# Patient Record
Sex: Female | Born: 1995 | Race: Black or African American | Hispanic: No | Marital: Single | Smoking: Never smoker
Health system: Southern US, Community
[De-identification: ages and names within clinical notes are randomized; demographics above are authoritative.]

---

## 2014-08-02 ENCOUNTER — Emergency Department (HOSPITAL_COMMUNITY)
Admission: EM | Admit: 2014-08-02 | Discharge: 2014-08-02 | Disposition: A | Discharge status: Home / Self Care | Payer: PRIVATE HEALTH INSURANCE | Source: Home / Self Care | Attending: Emergency Medicine | Admitting: Emergency Medicine

## 2014-08-02 ENCOUNTER — Encounter (HOSPITAL_COMMUNITY): Payer: Self-pay | Admitting: Emergency Medicine

## 2014-08-02 ENCOUNTER — Emergency Department (INDEPENDENT_AMBULATORY_CARE_PROVIDER_SITE_OTHER): Payer: PRIVATE HEALTH INSURANCE

## 2014-08-02 DIAGNOSIS — M79671 Pain in right foot: Secondary | ICD-10-CM

## 2014-08-02 DIAGNOSIS — S93601A Unspecified sprain of right foot, initial encounter: Secondary | ICD-10-CM

## 2014-08-02 MED ORDER — IBUPROFEN 800 MG PO TABS: 800.0000 mg | ORAL_TABLET | Freq: Three times a day (TID) | ORAL | Status: DC

## 2014-08-02 NOTE — ED Notes (Signed)
Pt states that she was jumping around last night and her ankle started to swell.

## 2014-08-02 NOTE — Discharge Instructions (Signed)
Joint Sprain A sprain is a tear or stretch in the ligaments that hold a joint together. Severe sprains may need as long as 3-6 weeks of immobilization and/or exercises to heal completely. Sprained joints should be rested and protected. If not, they can become unstable and prone to re-injury. Proper treatment can reduce your pain, shorten the period of disability, and reduce the risk of repeated injuries. TREATMENT   Rest and elevate the injured joint to reduce pain and swelling.  Apply ice packs to the injury for 20-30 minutes every 2-3 hours for the next 2-3 days.  Keep the injury wrapped in a compression bandage or splint as long as the joint is painful or as instructed by your caregiver.  Do not use the injured joint until it is completely healed to prevent re-injury and chronic instability. Follow the instructions of your caregiver.  Long-term sprain management may require exercises and/or treatment by a physical therapist. Taping or special braces may help stabilize the joint until it is completely better. SEEK MEDICAL CARE IF:   You develop increased pain or swelling of the joint.  You develop increasing redness and warmth of the joint.  You develop a fever.  It becomes stiff.  Your hand or foot gets cold or numb. Document Released: 07/15/2004 Document Revised: 08/30/2011 Document Reviewed: 06/24/2008 [facility] Patient Information 2015 [employer] [place]. This information is not intended to replace advice given to you by your health care provider. Make sure you discuss any questions you have with your health care provider.   Ibuprofen as needed for pain. Elevate and ice over next 24-48 hours. Post op shoe x 1-2 weeks.

## 2014-08-02 NOTE — ED Provider Notes (Signed)
CSN: [id number]     Arrival date & time 08/02/14  1535 History   First MD Initiated Contact with Patient 08/02/14 1644     Chief Complaint  Patient presents with  . Foot Injury   (Consider location/radiation/quality/duration/timing/severity/associated sxs/prior Treatment) HPI Comments: Erin Velasquez presents with right foot pain. She was jumping around last night and noted pain along the right 1st MTP base. Pain and swelling through night and today. Can bear weight though painful.   Patient is a 19 y.o. female presenting with foot injury. The history is provided by the patient.  Foot Injury   History reviewed. No pertinent past medical history. History reviewed. No pertinent past surgical history. History reviewed. No pertinent family history. History  Substance Use Topics  . Smoking status: Never Smoker   . Smokeless tobacco: Not on file  . Alcohol Use: No   OB History    No data available     Review of Systems  All other systems reviewed and are negative.   Allergies  Review of patient's allergies indicates no known allergies.  Home Medications   Prior to Admission medications   Medication Sig Start Date End Date Taking? Authorizing Provider  ibuprofen (ADVIL,MOTRIN) 800 MG tablet Take 1 tablet (800 mg total) by mouth 3 (three) times daily. 08/02/14   Riki SheerMichelle G Ursula Dermody, PA-C   BP 121/65 mmHg  Pulse 70  Temp(Src) 98.1 F (36.7 C) (Oral)  Resp 18  SpO2 100%  LMP 07/19/2014 Physical Exam  Constitutional: She is oriented to person, place, and time. She appears well-developed and well-nourished. No distress.  HENT:  Head: Normocephalic.  Pulmonary/Chest: Effort normal.  Musculoskeletal:  Mild edema forefoot, no erythema or warmth Pain to palpation along the base of the 1st MT. Full ROM. Normal sensation and strength  Neurological: She is alert and oriented to person, place, and time.  Skin: Skin is warm and dry.  Psychiatric: Her behavior is normal.  Nursing note and  vitals reviewed.   ED Course  Procedures (including critical care time) Labs Review Labs Reviewed - No data to display  Imaging Review Dg Foot Complete Right  08/02/2014   CLINICAL DATA:  Patient with medial right foot pain status post injury while jumping.  EXAM: RIGHT FOOT COMPLETE - 3+ VIEW  COMPARISON:  None.  FINDINGS: There is no evidence of fracture or dislocation. There is no evidence of arthropathy or other focal bone abnormality. Soft tissues are unremarkable.  IMPRESSION: No acute osseous abnormality.   Electronically Signed   By: Annia Beltrew  Davis M.D.   On: 08/02/2014 17:32     MDM   1. Sprain of foot, right, initial encounter   2. Foot pain, right    No fractures. Treat conservatively with post op shoe/ice/ and NSAIDs. F/U with Ortho if worsens.     Riki SheerMichelle G Shemekia Patane, PA-C 08/02/14 1800

## 2016-05-15 ENCOUNTER — Ambulatory Visit (HOSPITAL_COMMUNITY): Admission: EM | Admit: 2016-05-15 | Discharge: 2016-05-15 | Discharge status: Absent without leave | Payer: Self-pay

## 2016-05-15 ENCOUNTER — Ambulatory Visit (INDEPENDENT_AMBULATORY_CARE_PROVIDER_SITE_OTHER): Payer: BC Managed Care – PPO | Admitting: Family Medicine

## 2016-05-15 VITALS — BP 108/68 | HR 90 | Temp 98.9°F | Resp 16 | Ht 62.0 in | Wt 179.0 lb

## 2016-05-15 DIAGNOSIS — J029 Acute pharyngitis, unspecified: Secondary | ICD-10-CM | POA: Diagnosis not present

## 2016-05-15 LAB — POCT RAPID STREP A (OFFICE): Rapid Strep A Screen: NEGATIVE

## 2016-05-15 MED ORDER — AZITHROMYCIN 250 MG PO TABS: ORAL_TABLET | ORAL | 0 refills | Status: DC

## 2016-05-15 NOTE — Patient Instructions (Addendum)
Start Azithromycin Take 2 tabs x 1 dose, then 1 tab every day for x 4 days.   Gargle with warm salt water to reduce throat pain.  Take Ibuprofen and Advil for throat pain.  IF you received an x-ray today, you will receive an invoice from [facility]. Please contact [facility] at [phone] with questions or concerns regarding your invoice.   IF you received labwork today, you will receive an invoice from [facility]. Please contact [facility] at [phone] with questions or concerns regarding your invoice.   Our billing staff will not be able to assist you with questions regarding bills from these companies.  You will be contacted with the lab results as soon as they are available. The fastest way to get your results is to activate your My Chart account. Instructions are located on the last page of this paperwork. If you have not heard from us regarding the results in 2 weeks, please contact this office.

## 2016-05-15 NOTE — Progress Notes (Signed)
   Patient ID: Erin Velasquez, female    DOB: Jan 15, 1996, 20 y.o.   MRN: [id number]  PCP: No PCP Per Patient  Chief Complaint  Patient presents with  . Sore Throat    X 3 days   . Ear Pain    Bilateral   . Headache    Subjective:   HPI 20 year old female presents for evaluation of sore throat, ear pain, and headache times 3 days. [facility] Illness started with sore throat.  Two nights ago throat became really dry and sore. Last night throat was painfully sore. Left and right side of her throat contains white exudate and reddened.  Unknown of fever and denies chills. No nasal drainage. Headache times two days. Denies nausea and vomiting.  Social History   Social History  . Marital status: Single    Spouse name: N/A  . Number of children: N/A  . Years of education: N/A   Occupational History  . Not on file.   Social History Main Topics  . Smoking status: Never Smoker  . Smokeless tobacco: Not on file  . Alcohol use No  . Drug use: No  . Sexual activity: Not on file   Other Topics Concern  . Not on file   Social History Narrative  . No narrative on file    No family history on file. Review of Systems See HPI There are no active problems to display for this patient.    Prior to Admission medications   Not on File     No Known Allergies     Objective:  Physical Exam  Constitutional: She is oriented to person, place, and time.  HENT:  Nose: Mucosal edema and rhinorrhea present.  Mouth/Throat: Uvula swelling present. Oropharyngeal exudate, posterior oropharyngeal edema and posterior oropharyngeal erythema present. No tonsillar abscesses.  Cardiovascular: Normal rate, regular rhythm, normal heart sounds and intact distal pulses.   Pulmonary/Chest: Effort normal and breath sounds normal.  Neurological: She is alert and oriented to person, place, and time.  Skin: Skin is warm and dry.  Psychiatric: She has a normal mood and affect. Her  behavior is normal. Thought content normal.    Vitals:   05/15/16 1504  BP: 108/68  Pulse: 90  Resp: 16  Temp: 98.9 F (37.2 C)  ;s Assessment & Plan:  1. Sore throat - POCT rapid strep A-negative - Culture, Group A Strep-pending  Start Azithromycin Take 2 tabs x 1 dose, then 1 tab every day for x 4 days.   Gargle with warm salt water to reduce throat pain.  Take Ibuprofen and Advil for throat pain.  Return for care as needed.  Godfrey PickKimberly S. Tiburcio PeaHarris, MSN, FNP-C [facility]

## 2016-05-19 LAB — CULTURE, GROUP A STREP: ORGANISM ID, BACTERIA: NORMAL

## 2016-08-19 ENCOUNTER — Emergency Department (HOSPITAL_COMMUNITY): Payer: BC Managed Care – PPO

## 2016-08-19 ENCOUNTER — Emergency Department (HOSPITAL_COMMUNITY)
Admission: EM | Admit: 2016-08-19 | Discharge: 2016-08-19 | Disposition: A | Discharge status: Home / Self Care | Payer: BC Managed Care – PPO | Attending: Emergency Medicine | Admitting: Emergency Medicine

## 2016-08-19 ENCOUNTER — Encounter (HOSPITAL_COMMUNITY): Payer: Self-pay | Admitting: Emergency Medicine

## 2016-08-19 DIAGNOSIS — Z79899 Other long term (current) drug therapy: Secondary | ICD-10-CM | POA: Insufficient documentation

## 2016-08-19 DIAGNOSIS — R200 Anesthesia of skin: Secondary | ICD-10-CM | POA: Insufficient documentation

## 2016-08-19 DIAGNOSIS — M542 Cervicalgia: Secondary | ICD-10-CM | POA: Insufficient documentation

## 2016-08-19 LAB — I-STAT BETA HCG BLOOD, ED (MC, WL, AP ONLY)

## 2016-08-19 LAB — CBC WITH DIFFERENTIAL/PLATELET
Basophils Absolute: 0.1 10*3/uL (ref 0.0–0.1)
Basophils Relative: 1 %
Eosinophils Absolute: 0.2 10*3/uL (ref 0.0–0.7)
Eosinophils Relative: 2 %
HEMATOCRIT: 37 % (ref 36.0–46.0)
Hemoglobin: 12 g/dL (ref 12.0–15.0)
LYMPHS ABS: 2.9 10*3/uL (ref 0.7–4.0)
LYMPHS PCT: 22 %
MCH: 25.3 pg — ABNORMAL LOW (ref 26.0–34.0)
MCHC: 32.4 g/dL (ref 30.0–36.0)
MCV: 77.9 fL — AB (ref 78.0–100.0)
Monocytes Absolute: 0.7 10*3/uL (ref 0.1–1.0)
Monocytes Relative: 6 %
Neutro Abs: 8.9 10*3/uL — ABNORMAL HIGH (ref 1.7–7.7)
Neutrophils Relative %: 69 %
Platelets: 429 10*3/uL — ABNORMAL HIGH (ref 150–400)
RBC: 4.75 MIL/uL (ref 3.87–5.11)
RDW: 15.7 % — ABNORMAL HIGH (ref 11.5–15.5)
WBC: 12.8 10*3/uL — AB (ref 4.0–10.5)

## 2016-08-19 LAB — BASIC METABOLIC PANEL
Anion gap: 9 (ref 5–15)
BUN: 9 mg/dL (ref 6–20)
CO2: 26 mmol/L (ref 22–32)
Calcium: 9.5 mg/dL (ref 8.9–10.3)
Chloride: 102 mmol/L (ref 101–111)
Creatinine, Ser: 0.7 mg/dL (ref 0.44–1.00)
GFR calc Af Amer: >60 mL/min (ref >60)
GLUCOSE: 105 mg/dL — AB (ref 65–99)
POTASSIUM: 3 mmol/L — AB (ref 3.5–5.1)
Sodium: 137 mmol/L (ref 135–145)

## 2016-08-19 LAB — TSH: TSH: 1.588 u[IU]/mL (ref 0.350–4.500)

## 2016-08-19 LAB — MAGNESIUM: Magnesium: 2.1 mg/dL (ref 1.7–2.4)

## 2016-08-19 MED ORDER — METOCLOPRAMIDE HCL 5 MG/ML IJ SOLN
10.0000 mg | Freq: Once | INTRAMUSCULAR | Status: AC
  Administered 2016-08-19: 10 mg via INTRAVENOUS
  Filled 2016-08-19: qty 2

## 2016-08-19 MED ORDER — METHOCARBAMOL 500 MG PO TABS
500.0000 mg | ORAL_TABLET | Freq: Three times a day (TID) | ORAL | 0 refills | Status: AC
Start: 2016-08-19 — End: 2016-08-26

## 2016-08-19 MED ORDER — KETOROLAC TROMETHAMINE 15 MG/ML IJ SOLN
15.0000 mg | Freq: Once | INTRAMUSCULAR | Status: AC
  Administered 2016-08-19: 15 mg via INTRAVENOUS
  Filled 2016-08-19: qty 1

## 2016-08-19 MED ORDER — DIPHENHYDRAMINE HCL 50 MG/ML IJ SOLN
25.0000 mg | Freq: Once | INTRAMUSCULAR | Status: AC
  Administered 2016-08-19: 25 mg via INTRAVENOUS
  Filled 2016-08-19: qty 1

## 2016-08-19 MED ORDER — SODIUM CHLORIDE 0.9 % IV BOLUS (SEPSIS)
1000.0000 mL | Freq: Once | INTRAVENOUS | Status: AC
  Administered 2016-08-19: 1000 mL via INTRAVENOUS

## 2016-08-19 MED ORDER — NAPROXEN 500 MG PO TABS: 500.0000 mg | ORAL_TABLET | Freq: Two times a day (BID) | ORAL | 0 refills | Status: AC

## 2016-08-19 MED ORDER — GADOBENATE DIMEGLUMINE 529 MG/ML IV SOLN
20.0000 mL | Freq: Once | INTRAVENOUS | Status: AC | PRN
  Administered 2016-08-19: 17 mL via INTRAVENOUS

## 2016-08-19 MED ORDER — IOPAMIDOL (ISOVUE-370) INJECTION 76%
100.0000 mL | Freq: Once | INTRAVENOUS | Status: AC | PRN
  Administered 2016-08-19: 100 mL via INTRAVENOUS

## 2016-08-19 MED ORDER — POTASSIUM CHLORIDE CRYS ER 20 MEQ PO TBCR
40.0000 meq | EXTENDED_RELEASE_TABLET | Freq: Once | ORAL | Status: AC
  Administered 2016-08-19: 40 meq via ORAL
  Filled 2016-08-19: qty 2

## 2016-08-19 NOTE — ED Triage Notes (Signed)
Pt c/o right side arm and torso pain and tingling, stiffness, weakness, onset yesterday. Pt went to Urgent Care yesterday, had MRI, prescribed steroid. Pain has not improved. Reports headaches with blurred vision and back pain x past month. Right radial pulse 2+ regular.

## 2016-08-19 NOTE — ED Notes (Signed)
Patient transported to MRI 

## 2016-08-19 NOTE — ED Provider Notes (Signed)
[facility]-EMERGENCY DEPT Provider Note   CSN: [id number] Arrival date & time: 08/19/16  0845     History   Chief Complaint Chief Complaint  Patient presents with  . Numbness    HPI Erin Velasquez is a 21 y.o. female.  HPI   21 year old female with no significant past medical history presents with right arm numbness and neck pain. Patient states her symptoms began as an aching, throbbing right neck pain that was worse with movement yesterday. She went to urgent care and obtained an MRI of the cervical spine which was negative. She is placed on steroids and discharge. She states she has had persistent weakness and numbness of her right upper extremity since then, as well as mild lateral neck pain. No fevers, chills, or other symptoms. No recent illnesses. Of note, she does endorse increasing Lee frequent and headaches over the last several months as well. She has intermittent vision changes with these headaches that resolve with resolution of the headache. No history of migraines. No other medical complaints.  History reviewed. No pertinent past medical history.  There are no active problems to display for this patient.   History reviewed. No pertinent surgical history.  OB History    No data available       Home Medications    Prior to Admission medications   Medication Sig Start Date End Date Taking? Authorizing Provider  cetirizine (ZYRTEC) 10 MG tablet Take 10 mg by mouth daily.   Yes Historical Provider, MD  predniSONE (DELTASONE) 20 MG tablet Take 10 mg by mouth once.    Yes Historical Provider, MD  methocarbamol (ROBAXIN) 500 MG tablet Take 1 tablet (500 mg total) by mouth 3 (three) times daily. 08/19/16 08/26/16  Shaune Pollackameron Deone Leifheit, MD  naproxen (NAPROSYN) 500 MG tablet Take 1 tablet (500 mg total) by mouth 2 (two) times daily with a meal. 08/19/16 08/26/16  Shaune Pollackameron Cordney Barstow, MD    Family History History reviewed. No pertinent family history.  Social History Social History    Substance Use Topics  . Smoking status: Never Smoker  . Smokeless tobacco: Not on file  . Alcohol use No     Allergies   Patient has no known allergies.   Review of Systems Review of Systems  Constitutional: Positive for fatigue. Negative for chills and fever.  HENT: Negative for congestion and rhinorrhea.   Eyes: Negative for visual disturbance.  Respiratory: Negative for cough, shortness of breath and wheezing.   Cardiovascular: Negative for chest pain and leg swelling.  Gastrointestinal: Negative for abdominal pain, diarrhea, nausea and vomiting.  Genitourinary: Negative for dysuria and flank pain.  Musculoskeletal: Negative for neck pain and neck stiffness.  Skin: Negative for rash and wound.  Allergic/Immunologic: Negative for immunocompromised state.  Neurological: Positive for numbness. Negative for syncope, weakness and headaches.  All other systems reviewed and are negative.    Physical Exam Updated Vital Signs BP 123/71   Pulse 75   Temp 98.4 F (36.9 C) (Oral)   Resp 12   Ht (!) 5.2" (0.132 m)   Wt 180 lb (81.6 kg)   LMP 07/27/2016 (Approximate)   SpO2 100%   BMI 4680.24 kg/m   Physical Exam  Constitutional: She is oriented to person, place, and time. She appears well-developed and well-nourished. No distress.  HENT:  Head: Normocephalic and atraumatic.  Eyes: Conjunctivae are normal.  Neck: Neck supple.  Moderate right-sided paraspinal tenderness to palpation. No midline tenderness. Full range of motion. No carotid bruits. No  palpable muscular spasm.  Cardiovascular: Normal rate, regular rhythm and normal heart sounds.  Exam reveals no friction rub.   No murmur heard. Pulmonary/Chest: Effort normal and breath sounds normal. No respiratory distress. She has no wheezes. She has no rales.  Abdominal: She exhibits no distension.  Musculoskeletal: She exhibits no edema.  Neurological: She is alert and oriented to person, place, and time. She exhibits  normal muscle tone.  Skin: Skin is warm. Capillary refill takes less than 2 seconds.  Psychiatric: She has a normal mood and affect.  Nursing note and vitals reviewed.   Neurological Exam:  Mental Status: Alert and oriented to person, place, and time. Attention and concentration normal. Speech clear. Recent memory is intact. Cranial Nerves: Visual fields grossly intact. EOMI and PERRLA. No nystagmus noted. Facial sensation intact at forehead, maxillary cheek, and chin/mandible bilaterally. No facial asymmetry or weakness. Hearing grossly normal. Uvula is midline, and palate elevates symmetrically. Normal SCM and trapezius strength. Tongue midline without fasciculations. Motor: Muscle strength 5/5 in proximal and distal UE and LE bilaterally. No pronator drift. Muscle tone normal. Reflexes: 2+ and symmetrical in all four extremities.  Sensation: Intact to light touch in upper and lower extremities distally bilaterally.  Gait: Normal without ataxia. Coordination: Normal FTN bilaterally.     ED Treatments / Results  Labs (all labs ordered are listed, but only abnormal results are displayed) Labs Reviewed  CBC WITH DIFFERENTIAL/PLATELET - Abnormal; Notable for the following:       Result Value   WBC 12.8 (*)    MCV 77.9 (*)    MCH 25.3 (*)    RDW 15.7 (*)    Platelets 429 (*)    Neutro Abs 8.9 (*)    All other components within normal limits  BASIC METABOLIC PANEL - Abnormal; Notable for the following:    Potassium 3.0 (*)    Glucose, Bld 105 (*)    All other components within normal limits  MAGNESIUM  TSH  I-STAT BETA HCG BLOOD, ED ([facility], [facility], [facility] ONLY)    EKG  EKG Interpretation None       Radiology Ct Angio Neck W And/or Wo Contrast  Result Date: 08/19/2016 CLINICAL DATA:  Right neck pain.  Right body weakness and tingling. EXAM: CT ANGIOGRAPHY NECK TECHNIQUE: Multidetector CT imaging of the neck was performed using the standard protocol during bolus administration of  intravenous contrast. Multiplanar CT image reconstructions and MIPs were obtained to evaluate the vascular anatomy. Carotid stenosis measurements (when applicable) are obtained utilizing NASCET criteria, using the distal internal carotid diameter as the denominator. CONTRAST:  100 mL Isovue 370 IV COMPARISON:  MRI head 08/19/2016 FINDINGS: Aortic arch: Normal Right carotid system: Normal right carotid. Negative for dissection or atherosclerotic disease Left carotid system: Normal left carotid Vertebral arteries: Normal Skeleton: Normal Other neck: Negative Upper chest: Negative IMPRESSION: Normal CTA neck. Negative for dissection or atherosclerotic disease. Electronically Signed   By: Marlan Palau M.D.   On: 08/19/2016 12:23   Mr Brain W And Wo Contrast  Result Date: 08/19/2016 CLINICAL DATA:  Right arm numbness EXAM: MRI HEAD WITHOUT AND WITH CONTRAST TECHNIQUE: Multiplanar, multiecho pulse sequences of the brain and surrounding structures were obtained without and with intravenous contrast. CONTRAST:  17mL MULTIHANCE GADOBENATE DIMEGLUMINE 529 MG/ML IV SOLN COMPARISON:  None. FINDINGS: Brain: Negative for acute infarct. Negative for chronic ischemia. Normal white matter. Normal brainstem and cerebellum. Negative for hemorrhage or fluid collection. Prominent pituitary gland measuring 10 mm in height.  This has a convex superior border which could represent a pituitary adenoma. Vascular: Normal arterial flow void. Skull and upper cervical spine: Negative Sinuses/Orbits: Mucosal edema in the paranasal sinuses. Mastoid sinus clear. Normal orbit. Other: None IMPRESSION: Negative for acute or chronic ischemia. Negative for demyelinating disease Prominent pituitary gland. Possible underlying microadenoma. Correlate with prolactin level and symptoms. Electronically Signed   By: Marlan Palau M.D.   On: 08/19/2016 11:37    Procedures Procedures (including critical care time)  Medications Ordered in  ED Medications  sodium chloride 0.9 % bolus 1,000 mL (0 mLs Intravenous Stopped 08/19/16 1124)  metoCLOPramide (REGLAN) injection 10 mg (10 mg Intravenous Given 08/19/16 1003)  diphenhydrAMINE (BENADRYL) injection 25 mg (25 mg Intravenous Given 08/19/16 1005)  ketorolac (TORADOL) 15 MG/ML injection 15 mg (15 mg Intravenous Given 08/19/16 1006)  iopamidol (ISOVUE-370) 76 % injection 100 mL (100 mLs Intravenous Contrast Given 08/19/16 1140)  gadobenate dimeglumine (MULTIHANCE) injection 20 mL (17 mLs Intravenous Contrast Given 08/19/16 1118)  potassium chloride SA (K-DUR,KLOR-CON) CR tablet 40 mEq (40 mEq Oral Given 08/19/16 1238)     Initial Impression / Assessment and Plan / ED Course  I have reviewed the triage vital signs and the nursing notes.  Pertinent labs & imaging results that were available during my care of the patient were reviewed by me and considered in my medical decision making (see chart for details).     21 yo F with PMHx as above here with right arm tingling, numbness and neck pain. Suspect paraspinal neck sprain and spasm with possible concomitant brachial plexopathy, though NV is intact clinically. Exam nto c/w DVT and distal perfusion normal - doubt arterial injury. Given her intermittent HA, DDx includes complex migraine, also must consider MS. Recent MR C-Spine neg but some of her sx localize cortically - will check MR brain, also CT Angio to r/o dissection given spontaneous neck pain with neuro sx.  MR, CT neg. Labs reassuring. Mild hypokalemia which is repleted. Sx improved with analgesia, migraine tx in ED. Will place on naproxen, robaxin, and d/c with outpt f/u. Return precautions given.  Final Clinical Impressions(s) / ED Diagnoses   Final diagnoses:  Neck pain  Numbness    New Prescriptions New Prescriptions   METHOCARBAMOL (ROBAXIN) 500 MG TABLET    Take 1 tablet (500 mg total) by mouth 3 (three) times daily.   NAPROXEN (NAPROSYN) 500 MG TABLET    Take 1 tablet (500  mg total) by mouth 2 (two) times daily with a meal.     Shaune Pollack, MD 08/19/16 1314

## 2017-02-18 ENCOUNTER — Ambulatory Visit (INDEPENDENT_AMBULATORY_CARE_PROVIDER_SITE_OTHER): Payer: BC Managed Care – PPO | Admitting: Physician Assistant

## 2017-02-18 ENCOUNTER — Encounter: Payer: Self-pay | Admitting: Physician Assistant

## 2017-02-18 VITALS — BP 107/69 | HR 72 | Temp 99.0°F | Resp 16 | Ht 62.0 in | Wt 185.8 lb

## 2017-02-18 DIAGNOSIS — H6691 Otitis media, unspecified, right ear: Secondary | ICD-10-CM

## 2017-02-18 DIAGNOSIS — J029 Acute pharyngitis, unspecified: Secondary | ICD-10-CM

## 2017-02-18 LAB — POCT RAPID STREP A (OFFICE): Rapid Strep A Screen: NEGATIVE

## 2017-02-18 MED ORDER — AMOXICILLIN 500 MG PO CAPS: 1000.0000 mg | ORAL_CAPSULE | Freq: Three times a day (TID) | ORAL | 0 refills | Status: AC

## 2017-02-18 MED ORDER — HYDROCODONE-HOMATROPINE 5-1.5 MG/5ML PO SYRP: 5.0000 mL | ORAL_SOLUTION | Freq: Three times a day (TID) | ORAL | 0 refills | Status: AC | PRN

## 2017-02-18 NOTE — Patient Instructions (Signed)
Take the ENTIRE COURSE of your antibiotic, even if you start to feel better.  Ibuprofen 600mg  for pain.  Warm tea with honey and lemon will help relieve sore throat. You may want to sleep with humidifier in your room at night. Stay well hydrated - drink 2-3 liters of water/daily. See below for more home care tips.  Come back if you are not better in 5-7 days.   Pharyngitis Pharyngitis is redness, pain, and swelling (inflammation) of your pharynx. What are the causes? Pharyngitis is usually caused by infection. Most of the time, these infections are from viruses (viral) and are part of a cold. However, sometimes pharyngitis is caused by bacteria (bacterial). Pharyngitis can also be caused by allergies. Viral pharyngitis may be spread from person to person by coughing, sneezing, and personal items or utensils (cups, forks, spoons, toothbrushes). Bacterial pharyngitis may be spread from person to person by more intimate contact, such as kissing. What are the signs or symptoms? Symptoms of pharyngitis include:  Sore throat.  Tiredness (fatigue).  Low-grade fever.  Headache.  Joint pain and muscle aches.  Skin rashes.  Swollen lymph nodes.  Plaque-like film on throat or tonsils (often seen with bacterial pharyngitis).  How is this treated? Viral pharyngitis will usually get better in 3-4 days without the use of medicine. Bacterial pharyngitis is treated with medicines that kill germs (antibiotics). Follow these instructions at home:  Drink enough water and fluids to keep your urine clear or pale yellow.  Only take over-the-counter or prescription medicines as directed by your health care provider: ? If you are prescribed antibiotics, make sure you finish them even if you start to feel better. ? Do not take aspirin.  Get lots of rest.  Gargle with 8 oz of salt water ( tsp of salt per 1 qt of water) as often as every 1-2 hours to soothe your throat.  Throat lozenges (if you are  not at risk for choking) or sprays may be used to soothe your throat. Contact a health care provider if:  You have large, tender lumps in your neck.  You have a rash.  You cough up green, yellow-brown, or bloody spit. Get help right away if:  Your neck becomes stiff.  You drool or are unable to swallow liquids.  You vomit or are unable to keep medicines or liquids down.  You have severe pain that does not go away with the use of recommended medicines.  You have trouble breathing (not caused by a stuffy nose). This information is not intended to replace advice given to you by your health care provider. Make sure you discuss any questions you have with your health care provider. Document Released: 06/07/2005 Document Revised: 11/13/2015 Document Reviewed: 02/12/2013 [employer] Interactive Patient Education  2017 [employer]

## 2017-02-18 NOTE — Progress Notes (Signed)
   Erin Velasquez  MRN: [id number] DOB: 1996-06-05  PCP: Patient, No Pcp Per  Subjective:  Pt is a 21 year old female who presents to clinic for sore throat x 1 week. Painful to swallow. Endorses headache and night sweats. This morning the pain in her throat is about 8/10 on 10 point scale. She developed ear pain and nasal drainage this morning.  She has taken tylenol cold and flu, allergy medication - nothing is helping.  Denies cough, n/v, sneezing, chest pain, shob, chills.   Review of Systems  Constitutional: Negative for chills, diaphoresis, fatigue and fever.  HENT: Positive for sore throat. Negative for congestion, postnasal drip, rhinorrhea, sinus pressure and sneezing.   Respiratory: Negative for cough, chest tightness, shortness of breath and wheezing.   Cardiovascular: Negative for chest pain and palpitations.  Gastrointestinal: Negative for abdominal pain, diarrhea, nausea and vomiting.  Neurological: Positive for headaches. Negative for weakness and light-headedness.  Psychiatric/Behavioral: Positive for sleep disturbance.    There are no active problems to display for this patient.   No current outpatient prescriptions on file prior to visit.   No current facility-administered medications on file prior to visit.     No Known Allergies   Objective:  BP 107/69   Pulse 72   Temp 99 F (37.2 C) (Oral)   Resp 16   Ht 5\' 2"  (1.575 m)   Wt 185 lb 12.8 oz (84.3 kg)   LMP 02/10/2017   SpO2 99%   BMI 33.98 kg/m   Physical Exam  Constitutional: She is oriented to person, place, and time and well-developed, well-nourished, and in no distress. No distress.  HENT:  Right Ear: Tympanic membrane is bulging. A middle ear effusion is present.  Left Ear: Tympanic membrane normal.  Mouth/Throat: Uvula is midline and mucous membranes are normal. Oropharyngeal exudate, posterior oropharyngeal edema and posterior oropharyngeal erythema present.  Cardiovascular: Normal rate,  regular rhythm and normal heart sounds.   Lymphadenopathy:    She has cervical adenopathy.       Right cervical: Superficial cervical adenopathy present.       Left cervical: Superficial cervical adenopathy present.  Neurological: She is alert and oriented to person, place, and time. GCS score is 15.  Skin: Skin is warm and dry.  Psychiatric: Mood, memory, affect and judgment normal.  Vitals reviewed.   Results for orders placed or performed in visit on 02/18/17  POCT rapid strep A  Result Value Ref Range   Rapid Strep A Screen Negative Negative    Assessment and Plan :  1. Sore throat - POCT rapid strep A - Culture, Group A Strep - HYDROcodone-homatropine (HYCODAN) 5-1.5 MG/5ML syrup; Take 5 mLs by mouth every 8 (eight) hours as needed for cough.  Dispense: 120 mL; Refill: 0 - Culture is pending. Will contact with results. Supportive care encouraged.  2. Right otitis media, unspecified otitis media type - amoxicillin (AMOXIL) 500 MG capsule; Take 2 capsules (1,000 mg total) by mouth every 8 (eight) hours.  Dispense: 21 capsule; Refill: 0   Marco CollieWhitney Meldon Hanzlik, PA-C  [facility] 02/18/2017 5:30 PM

## 2017-02-21 LAB — CULTURE, GROUP A STREP: Strep A Culture: NEGATIVE

## 2018-10-19 IMAGING — CT CT ANGIO NECK
2 of 7 series · 8 of 33 positions shown · IV contrast (ISOVUE 370)
Comparison: MRI head 08/19/2016

CLINICAL DATA: Right neck pain.  Right body weakness and tingling.

EXAM:
CT ANGIOGRAPHY NECK
TECHNIQUE: Multidetector CT imaging of the neck was performed using the
standard protocol during bolus administration of intravenous
contrast. Multiplanar CT image reconstructions and MIPs were
obtained to evaluate the vascular anatomy. Carotid stenosis
measurements (when applicable) are obtained utilizing NASCET
criteria, using the distal internal carotid diameter as the
denominator.
CONTRAST:  100 mL Isovue 370 IV

[Series 4: cta neck · axial · 0.46mm/px · z∈[-300,-224]mm · 2 of 114 slices shown]
[im 38/114  soft-tissue]
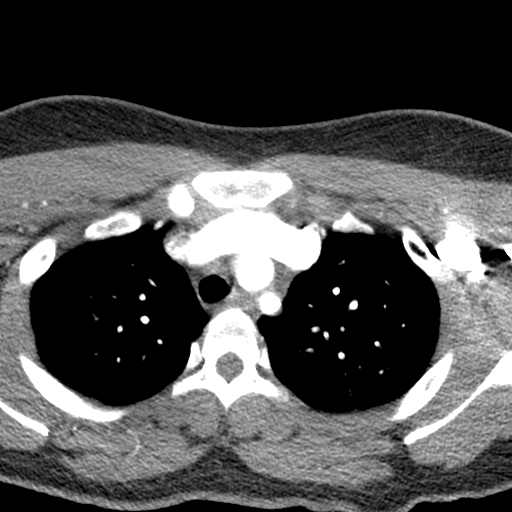
[im 76/114  soft-tissue]
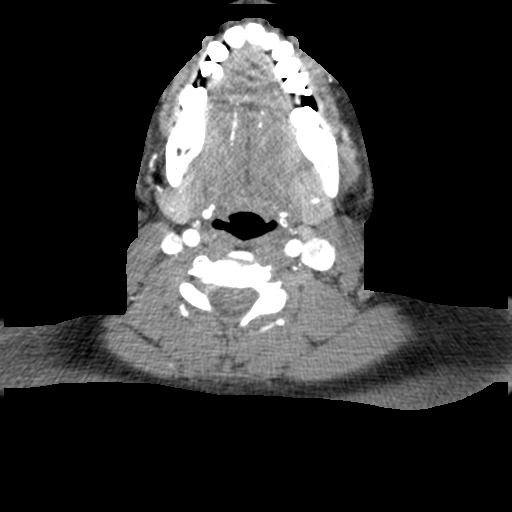

[Series 6: ax thin · axial · 0.39mm/px · z∈[-341,-180]mm · 6 of 227 slices shown]
[im 33/227  soft-tissue]
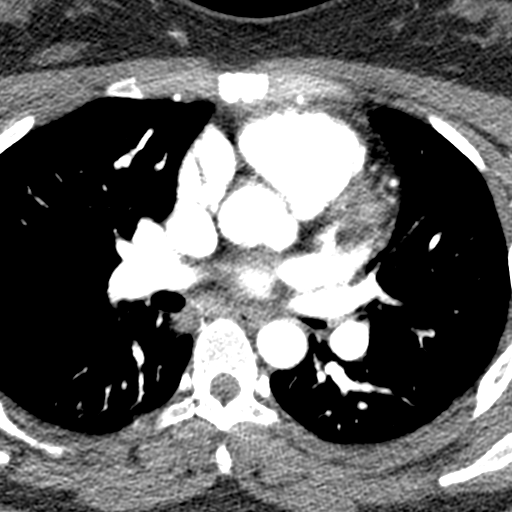
[im 65/227  bone]
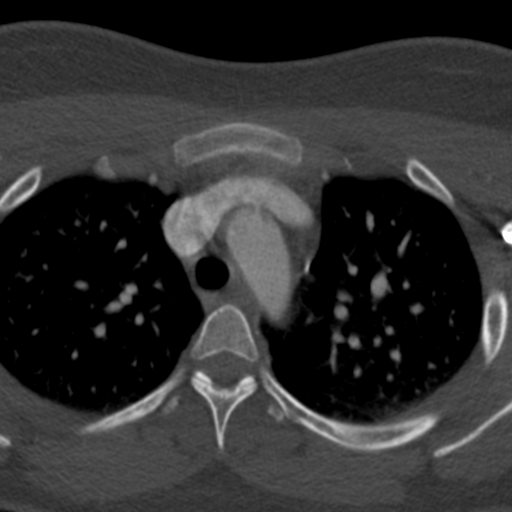
[im 97/227  soft-tissue]
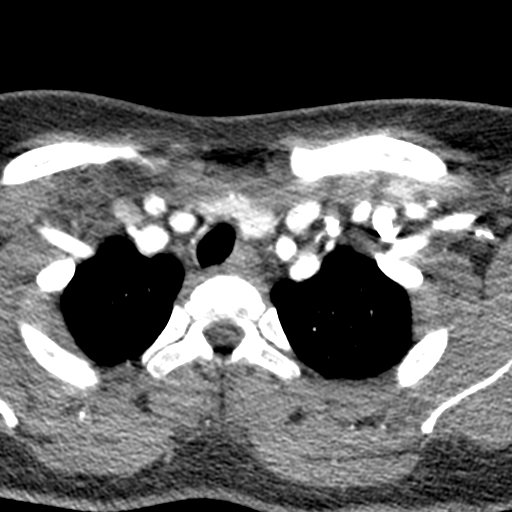
[im 130/227  bone]
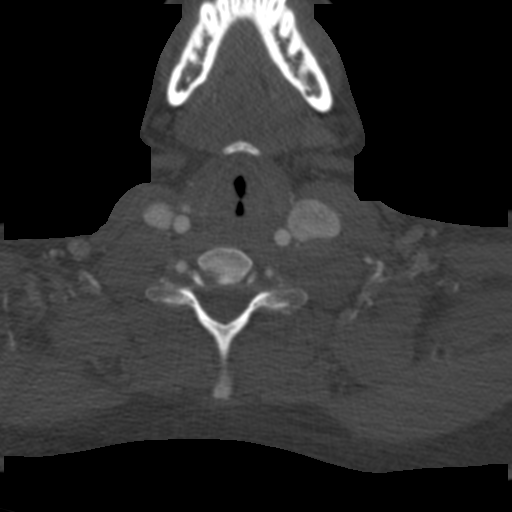
[im 162/227  soft-tissue]
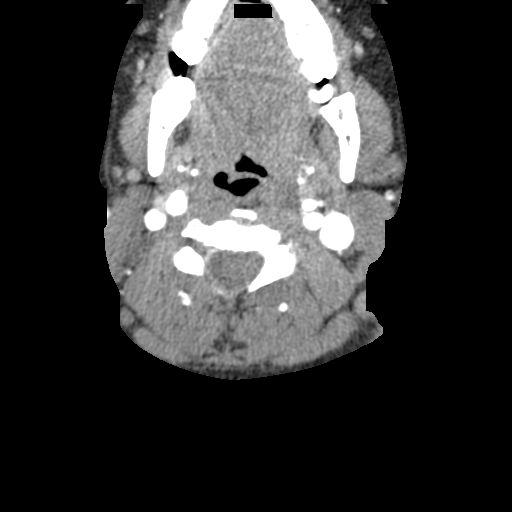
[im 194/227  bone]
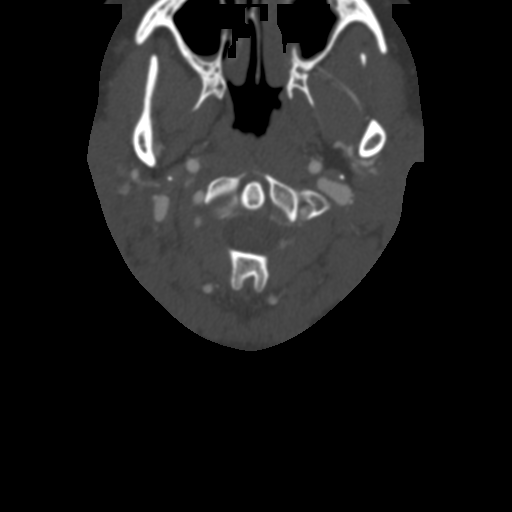

[8 of 33 positions shown; findings below may reference images not displayed]

FINDINGS: Aortic arch: Normal

Right carotid system: Normal right carotid. Negative for dissection
or atherosclerotic disease

Left carotid system: Normal left carotid

Vertebral arteries: Normal

Skeleton: Normal

Other neck: Negative

Upper chest: Negative
IMPRESSION: Normal CTA neck. Negative for dissection or atherosclerotic disease.

## 2024-06-20 ENCOUNTER — Ambulatory Visit
Admission: RE | Admit: 2024-06-20 | Discharge: 2024-06-20 | Disposition: A | Discharge status: Home / Self Care | Source: Ambulatory Visit

## 2024-06-20 VITALS — BP 114/77 | HR 79 | Temp 98.1°F | Resp 18

## 2024-06-20 DIAGNOSIS — J0110 Acute frontal sinusitis, unspecified: Secondary | ICD-10-CM | POA: Diagnosis not present

## 2024-06-20 MED ORDER — PROMETHAZINE-DM 6.25-15 MG/5ML PO SYRP: 10.0000 mL | ORAL_SOLUTION | Freq: Three times a day (TID) | ORAL | 0 refills | Status: AC | PRN

## 2024-06-20 MED ORDER — AMOXICILLIN-POT CLAVULANATE 875-125 MG PO TABS: 1.0000 | ORAL_TABLET | Freq: Two times a day (BID) | ORAL | 0 refills | Status: AC

## 2024-06-20 MED ORDER — AZELASTINE HCL 0.1 % NA SOLN: 1.0000 | Freq: Two times a day (BID) | NASAL | 1 refills | Status: AC

## 2024-06-20 MED ORDER — PREDNISONE 20 MG PO TABS: 40.0000 mg | ORAL_TABLET | Freq: Every day | ORAL | 0 refills | Status: AC

## 2024-06-20 NOTE — ED Provider Notes (Signed)
 " [facility]  Note:  This document was prepared using Dragon voice recognition software and may include unintentional dictation errors.  MRN: [id number] DOB: [birth year]  Subjective:   Erin Velasquez is a 28 y.o. female presenting for cough, sinus congestion, intermittent cold-like symptoms x 2 weeks.  Patient reports this sinus congestion really began to bother her 2 to 3 days ago.  Patient has been taking Mucinex with minimal improvement.  Patient reports some chest congestion but mainly concerned with sinus pressure and pain.  Patient reports past history of sinus infections with similar presentation to current symptoms.  Patient denies any known sick contacts  Current Medications[1]   Allergies[2]  History reviewed. No pertinent past medical history.   History reviewed. No pertinent surgical history.  History reviewed. No pertinent family history.  Social History[3]  ROS Refer to HPI for ROS details.  Objective:    Vitals: BP 114/77 (BP Location: Left Arm)   Pulse 79   Temp 98.1 F (36.7 C) (Oral)   Resp 18   LMP  (Within Years)   SpO2 96%   Physical Exam Vitals and nursing note reviewed.  Constitutional:      General: She is not in acute distress.    Appearance: She is well-developed. She is not ill-appearing or toxic-appearing.  HENT:     Head: Normocephalic and atraumatic.     Nose: Nasal tenderness, mucosal edema, congestion and rhinorrhea present.     Right Sinus: Maxillary sinus tenderness and frontal sinus tenderness present.     Left Sinus: Maxillary sinus tenderness and frontal sinus tenderness present.     Mouth/Throat:     Mouth: Mucous membranes are moist.     Pharynx: Oropharynx is clear.  Eyes:     General:        Right eye: No discharge.        Left eye: No discharge.     Extraocular Movements: Extraocular movements intact.     Conjunctiva/sclera: Conjunctivae normal.  Cardiovascular:     Rate and Rhythm: Normal rate.   Pulmonary:     Effort: Pulmonary effort is normal. No respiratory distress.     Breath sounds: No stridor. No wheezing.  Skin:    General: Skin is warm and dry.  Neurological:     General: No focal deficit present.     Mental Status: She is alert and oriented to person, place, and time.  Psychiatric:        Mood and Affect: Mood normal.        Behavior: Behavior normal.     Procedures  No results found for this or any previous visit (from the past 24 hours).  Assessment and Plan :     Discharge Instructions       1. Acute non-recurrent frontal sinusitis (Primary) - amoxicillin -clavulanate (AUGMENTIN) 875-125 MG tablet; Take 1 tablet by mouth every 12 (twelve) hours.  Dispense: 14 tablet; Refill: 0 - predniSONE (DELTASONE) 20 MG tablet; Take 2 tablets (40 mg total) by mouth daily for 5 days.  Dispense: 10 tablet; Refill: 0 - azelastine (ASTELIN) 0.1 % nasal spray; Place 1 spray into both nostrils 2 (two) times daily. Use in each nostril as directed  Dispense: 30 mL; Refill: 1 - promethazine-dextromethorphan (PROMETHAZINE-DM) 6.25-15 MG/5ML syrup; Take 10 mLs by mouth 3 (three) times daily as needed.  Dispense: 240 mL; Refill: 0  -Continue to monitor symptoms for any change in severity if there is any escalation of current symptoms or development  of new symptoms follow-up in ER for further evaluation and management.       Leeandra Ellerson B Emidio Warrell    [1] No current facility-administered medications for this encounter.  Current Outpatient Medications:    amoxicillin -clavulanate (AUGMENTIN) 875-125 MG tablet, Take 1 tablet by mouth every 12 (twelve) hours., Disp: 14 tablet, Rfl: 0   azelastine (ASTELIN) 0.1 % nasal spray, Place 1 spray into both nostrils 2 (two) times daily. Use in each nostril as directed, Disp: 30 mL, Rfl: 1   predniSONE (DELTASONE) 20 MG tablet, Take 2 tablets (40 mg total) by mouth daily for 5 days., Disp: 10 tablet, Rfl: 0   promethazine-dextromethorphan  (PROMETHAZINE-DM) 6.25-15 MG/5ML syrup, Take 10 mLs by mouth 3 (three) times daily as needed., Disp: 240 mL, Rfl: 0   HYDROcodone -homatropine (HYCODAN) 5-1.5 MG/5ML syrup, Take 5 mLs by mouth every 8 (eight) hours as needed for cough., Disp: 120 mL, Rfl: 0 [2] No Known Allergies [3]  Social History Tobacco Use   Smoking status: Never   Smokeless tobacco: Never  Substance Use Topics   Alcohol use: No   Drug use: No     Aurea Goodell B, NP 06/20/24 [id number]  "

## 2024-06-20 NOTE — Discharge Instructions (Signed)
" °  1. Acute non-recurrent frontal sinusitis (Primary) - amoxicillin -clavulanate (AUGMENTIN) 875-125 MG tablet; Take 1 tablet by mouth every 12 (twelve) hours.  Dispense: 14 tablet; Refill: 0 - predniSONE (DELTASONE) 20 MG tablet; Take 2 tablets (40 mg total) by mouth daily for 5 days.  Dispense: 10 tablet; Refill: 0 - azelastine (ASTELIN) 0.1 % nasal spray; Place 1 spray into both nostrils 2 (two) times daily. Use in each nostril as directed  Dispense: 30 mL; Refill: 1 - promethazine-dextromethorphan (PROMETHAZINE-DM) 6.25-15 MG/5ML syrup; Take 10 mLs by mouth 3 (three) times daily as needed.  Dispense: 240 mL; Refill: 0  -Continue to monitor symptoms for any change in severity if there is any escalation of current symptoms or development of new symptoms follow-up in ER for further evaluation and management. "

## 2024-06-20 NOTE — ED Triage Notes (Signed)
 Pt present with c/o cough and sinus congestion x 3 days. States she has had intermittent symptoms x 2 weeks.  Home interventions: Mucinex
